# Patient Record
Sex: Male | Born: 1985 | Race: White | Hispanic: No | Marital: Single
Health system: Southern US, Community
[De-identification: ages and names within clinical notes are randomized; demographics above are authoritative.]

---
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
## 2006-07-08 ENCOUNTER — Emergency Department: Payer: Self-pay | Admitting: Emergency Medicine

## 2006-10-17 ENCOUNTER — Emergency Department: Payer: Self-pay | Admitting: Unknown Physician Specialty

## 2007-10-25 ENCOUNTER — Emergency Department: Payer: Self-pay | Admitting: Emergency Medicine

## 2008-10-17 ENCOUNTER — Emergency Department: Payer: Self-pay | Admitting: Emergency Medicine

## 2008-12-05 ENCOUNTER — Emergency Department: Payer: Self-pay | Admitting: Unknown Physician Specialty

## 2017-02-05 ENCOUNTER — Other Ambulatory Visit: Payer: Self-pay

## 2017-02-05 ENCOUNTER — Emergency Department
Admission: EM | Admit: 2017-02-05 | Discharge: 2017-02-05 | Disposition: A | Discharge status: Home / Self Care | Payer: BLUE CROSS/BLUE SHIELD | Attending: Emergency Medicine | Admitting: Emergency Medicine

## 2017-02-05 ENCOUNTER — Emergency Department: Payer: BLUE CROSS/BLUE SHIELD

## 2017-02-05 DIAGNOSIS — K219 Gastro-esophageal reflux disease without esophagitis: Secondary | ICD-10-CM | POA: Insufficient documentation

## 2017-02-05 DIAGNOSIS — R079 Chest pain, unspecified: Secondary | ICD-10-CM | POA: Diagnosis present

## 2017-02-05 LAB — CBC WITH DIFFERENTIAL/PLATELET
Basophils Absolute: 0.1 10*3/uL (ref 0–0.1)
Basophils Relative: 1 %
Eosinophils Absolute: 0.1 10*3/uL (ref 0–0.7)
Eosinophils Relative: 1 %
HCT: 40.8 % (ref 40.0–52.0)
Hemoglobin: 14.3 g/dL (ref 13.0–18.0)
Lymphocytes Relative: 14 %
Lymphs Abs: 1.6 10*3/uL (ref 1.0–3.6)
MCH: 28.6 pg (ref 26.0–34.0)
MCHC: 34.9 g/dL (ref 32.0–36.0)
MCV: 81.9 fL (ref 80.0–100.0)
Monocytes Absolute: 0.6 10*3/uL (ref 0.2–1.0)
Monocytes Relative: 5 %
Neutro Abs: 8.9 10*3/uL — ABNORMAL HIGH (ref 1.4–6.5)
Neutrophils Relative %: 79 %
Platelets: 267 10*3/uL (ref 150–440)
RBC: 4.98 MIL/uL (ref 4.40–5.90)
RDW: 13 % (ref 11.5–14.5)
WBC: 11.2 10*3/uL — ABNORMAL HIGH (ref 3.8–10.6)

## 2017-02-05 LAB — URINALYSIS, COMPLETE (UACMP) WITH MICROSCOPIC
Bacteria, UA: NONE SEEN
Bilirubin Urine: NEGATIVE
Glucose, UA: NEGATIVE mg/dL
Hgb urine dipstick: NEGATIVE
Ketones, ur: NEGATIVE mg/dL
Leukocytes, UA: NEGATIVE
Nitrite: NEGATIVE
Protein, ur: NEGATIVE mg/dL
Specific Gravity, Urine: 1.003 — ABNORMAL LOW (ref 1.005–1.030)
Squamous Epithelial / HPF: NONE SEEN
pH: 8 (ref 5.0–8.0)

## 2017-02-05 LAB — COMPREHENSIVE METABOLIC PANEL
ALT: 33 U/L (ref 17–63)
AST: 26 U/L (ref 15–41)
Albumin: 4.6 g/dL (ref 3.5–5.0)
Alkaline Phosphatase: 59 U/L (ref 38–126)
Anion gap: 7 (ref 5–15)
BUN: 10 mg/dL (ref 6–20)
CO2: 28 mmol/L (ref 22–32)
Calcium: 9.3 mg/dL (ref 8.9–10.3)
Chloride: 105 mmol/L (ref 101–111)
Creatinine, Ser: 1.02 mg/dL (ref 0.61–1.24)
GFR calc Af Amer: >60 mL/min (ref >60)
GFR calc non Af Amer: >60 mL/min (ref >60)
Glucose, Bld: 113 mg/dL — ABNORMAL HIGH (ref 65–99)
Potassium: 3.8 mmol/L (ref 3.5–5.1)
Sodium: 140 mmol/L (ref 135–145)
Total Bilirubin: 0.4 mg/dL (ref 0.3–1.2)
Total Protein: 7.4 g/dL (ref 6.5–8.1)

## 2017-02-05 LAB — TROPONIN I: Troponin I: <0.03 ng/mL (ref <0.03)

## 2017-02-05 MED ORDER — DICYCLOMINE HCL 10 MG PO CAPS
10.0000 mg | ORAL_CAPSULE | Freq: Once | ORAL | Status: AC
  Administered 2017-02-05: 10 mg via ORAL
  Filled 2017-02-05: qty 1

## 2017-02-05 MED ORDER — OMEPRAZOLE 20 MG PO CPDR: 20.0000 mg | DELAYED_RELEASE_CAPSULE | Freq: Two times a day (BID) | ORAL | 1 refills | Status: AC

## 2017-02-05 NOTE — ED Provider Notes (Signed)
[facility] Emergency Department Provider Note  ____________________________________________  Time seen: Approximately 8:50 PM  I have reviewed the triage vital signs and the nursing notes.   HISTORY  Chief Complaint Chest Pain    HPI Joe Mendoza is a 31 y.o. male presenting to the emergency department with bilateral upper rib cage pain for the past 2 nights. Patient states the pain is associated with nausea and mild shortness of breath. Patient denies recent illness and non-productive cough. He denies chest pain and chest tightness. Patient states he has a history of GERD and takes Prilosec. Patient states that his pain started yesterday in association with a supine position and after eating. Patient states that the pain went away completely and recurred today at 3:00 PM. Patient states that he drinks multiple sodas daily. Denies vomiting or abdominal pain. No hematochezia or hematuria. He denies dysuria. He currently rates his pain 4 out of 10 in intensity.   No past medical history on file.  There are no active problems to display for this patient.   No past surgical history on file.  Prior to Admission medications   Medication Sig Start Date End Date Taking? Authorizing Provider  omeprazole (PRILOSEC) 20 MG capsule Take 1 capsule (20 mg total) by mouth 2 (two) times daily. 02/05/17 03/05/17  Orvil Feil, PA-C    Allergies Omnicef [cefdinir]  No family history on file.  Social History Social History  Substance Use Topics  . Smoking status: Not on file  . Smokeless tobacco: Not on file  . Alcohol use Not on file     Review of Systems  Constitutional: No fever/chills Eyes: No visual changes. No discharge ENT: No upper respiratory complaints. Cardiovascular: no chest pain. Respiratory: no cough. No SOB. Gastrointestinal: No abdominal pain.  No nausea, no vomiting.  No diarrhea.  No constipation. Musculoskeletal: Negative for  musculoskeletal pain. Skin: Negative for rash, abrasions, lacerations, ecchymosis. Neurological: Patient has headache, no focal weakness or numbness.  ____________________________________________   PHYSICAL EXAM:  VITAL SIGNS: ED Triage Vitals  Enc Vitals Group     BP 02/05/17 1944 (!) 143/83     Pulse Rate 02/05/17 1944 91     Resp 02/05/17 1944 20     Temp 02/05/17 1944 98.2 F (36.8 C)     Temp Source 02/05/17 1944 Oral     SpO2 02/05/17 1944 99 %     Weight 02/05/17 1945 180 lb (81.6 kg)     Height 02/05/17 1945  (1.676 m)     Head Circumference --      Peak Flow --      Pain Score 02/05/17 1944 8     Pain Loc --      Pain Edu? --      Excl. in GC? --      Constitutional: Alert and oriented. Well appearing and in no acute distress. Eyes: Conjunctivae are normal. PERRL. EOMI. Head: Atraumatic. Cardiovascular: Normal rate, regular rhythm. Normal S1 and S2.  Good peripheral circulation. No reproducible pain to palpation over the chest wall. Respiratory: Normal respiratory effort without tachypnea or retractions. Lungs CTAB. Good air entry to the bases with no decreased or absent breath sounds. Gastrointestinal: Bowel sounds 4 quadrants. Soft and nontender to palpation. No guarding or rigidity. No palpable masses. No distention. No CVA tenderness. Musculoskeletal: Full range of motion to all extremities. No gross deformities appreciated. Neurologic:  Normal speech and language. No gross focal neurologic deficits are appreciated.  Skin:  Skin is warm, dry and intact. No rash noted. Psychiatric: Mood and affect are normal. Speech and behavior are normal. Patient exhibits appropriate insight and judgement.   ____________________________________________   LABS (all labs ordered are listed, but only abnormal results are displayed)  Labs Reviewed  CBC WITH DIFFERENTIAL/PLATELET - Abnormal; Notable for the following:       Result Value   WBC 11.2 (*)    Neutro Abs  8.9 (*)    All other components within normal limits  COMPREHENSIVE METABOLIC PANEL - Abnormal; Notable for the following:    Glucose, Bld 113 (*)    All other components within normal limits  URINALYSIS, COMPLETE (UACMP) WITH MICROSCOPIC - Abnormal; Notable for the following:    Color, Urine COLORLESS (*)    APPearance CLEAR (*)    Specific Gravity, Urine 1.003 (*)    All other components within normal limits  TROPONIN I   ____________________________________________  EKG  Normal sinus rhythm without ST segment elevation. ____________________________________________  RADIOLOGY Geraldo Pitter, personally viewed and evaluated these images (plain radiographs) as part of my medical decision making, as well as reviewing the written report by the radiologist.  Dg Chest 2 View  Result Date: 02/05/2017 CLINICAL DATA:  Pt states he woke up last night with bilat rib pain denies any injury. Pt worse on movement and inspiration. States now pain is just to right rib area, no chest or abd pain. Pt co shob EXAM: CHEST  2 VIEW COMPARISON:  None. FINDINGS: The heart size and mediastinal contours are within normal limits. Both lungs are clear. No pleural effusion or pneumothorax. Skeletal structures are unremarkable. IMPRESSION: No active cardiopulmonary disease. Electronically Signed   By: Amie Portland M.D.   On: 02/05/2017 20:19    ____________________________________________    PROCEDURES  Procedure(s) performed:    Procedures    Medications  dicyclomine (BENTYL) capsule 10 mg (10 mg Oral Given 02/05/17 2133)     ____________________________________________   INITIAL IMPRESSION / ASSESSMENT AND PLAN / ED COURSE  Pertinent labs & imaging results that were available during my care of the patient were reviewed by me and considered in my medical decision making (see chart for details).  Review of the [place] CSRS was performed in accordance of the [facility] prior to dispensing any  controlled drugs.    Assessment and Plan:  GERD Patient presents to the emergency department with bilateral upper rib pain. CBC revealed mild leukocytosis. CMP, troponin, urinalysis and chest x-ray were reassuring. Patient states that he has not been taking his Prilosec as directed. History is consistent with GERD. Patient was discharged with Prilosec. He was advised to seek care with primary care if symptoms are refractory to Prilosec. Patient's vital signs remained reassuring throughout emergency department encounter. All patient questions were answered.   ____________________________________________  FINAL CLINICAL IMPRESSION(S) / ED DIAGNOSES  Final diagnoses:  Chronic GERD      NEW MEDICATIONS STARTED DURING THIS VISIT:  Discharge Medication List as of 02/05/2017 10:11 PM    START taking these medications   Details  omeprazole (PRILOSEC) 20 MG capsule Take 1 capsule (20 mg total) by mouth 2 (two) times daily., Starting Thu 02/05/2017, Until Thu 03/05/2017, Print            This chart was dictated using voice recognition software/Dragon. Despite best efforts to proofread, errors can occur which can change the meaning. Any change was purely unintentional.\   Pia Mau [place], [place] 02/05/17 2342  Dionne BucySiadecki, Sebastian, MD 02/05/17 [id number]

## 2017-02-05 NOTE — ED Notes (Signed)
Pt c/o bilateral rib cage pain xfew days, denies injury. Pt denies any CP or cough. PT states some SOB, denies fever

## 2017-02-05 NOTE — ED Triage Notes (Signed)
Pt states he woke up last night with bilat rib pain denies any injury. Pt worse on movement and inspiration. States now pain is just to right rib area, no chest or abd pain. Pt co shob, none noted at this time.

## 2018-05-23 IMAGING — CR DG CHEST 2V
2 series · 2 of 2 positions shown · non-contrast
Comparison: None.

CLINICAL DATA: Pt states he woke up last night with bilat rib pain
denies any injury. Pt worse on movement and inspiration. States now
pain is just to right rib area, no chest or abd pain. Pt co shob

EXAM:
CHEST  2 VIEW

[chest pa]
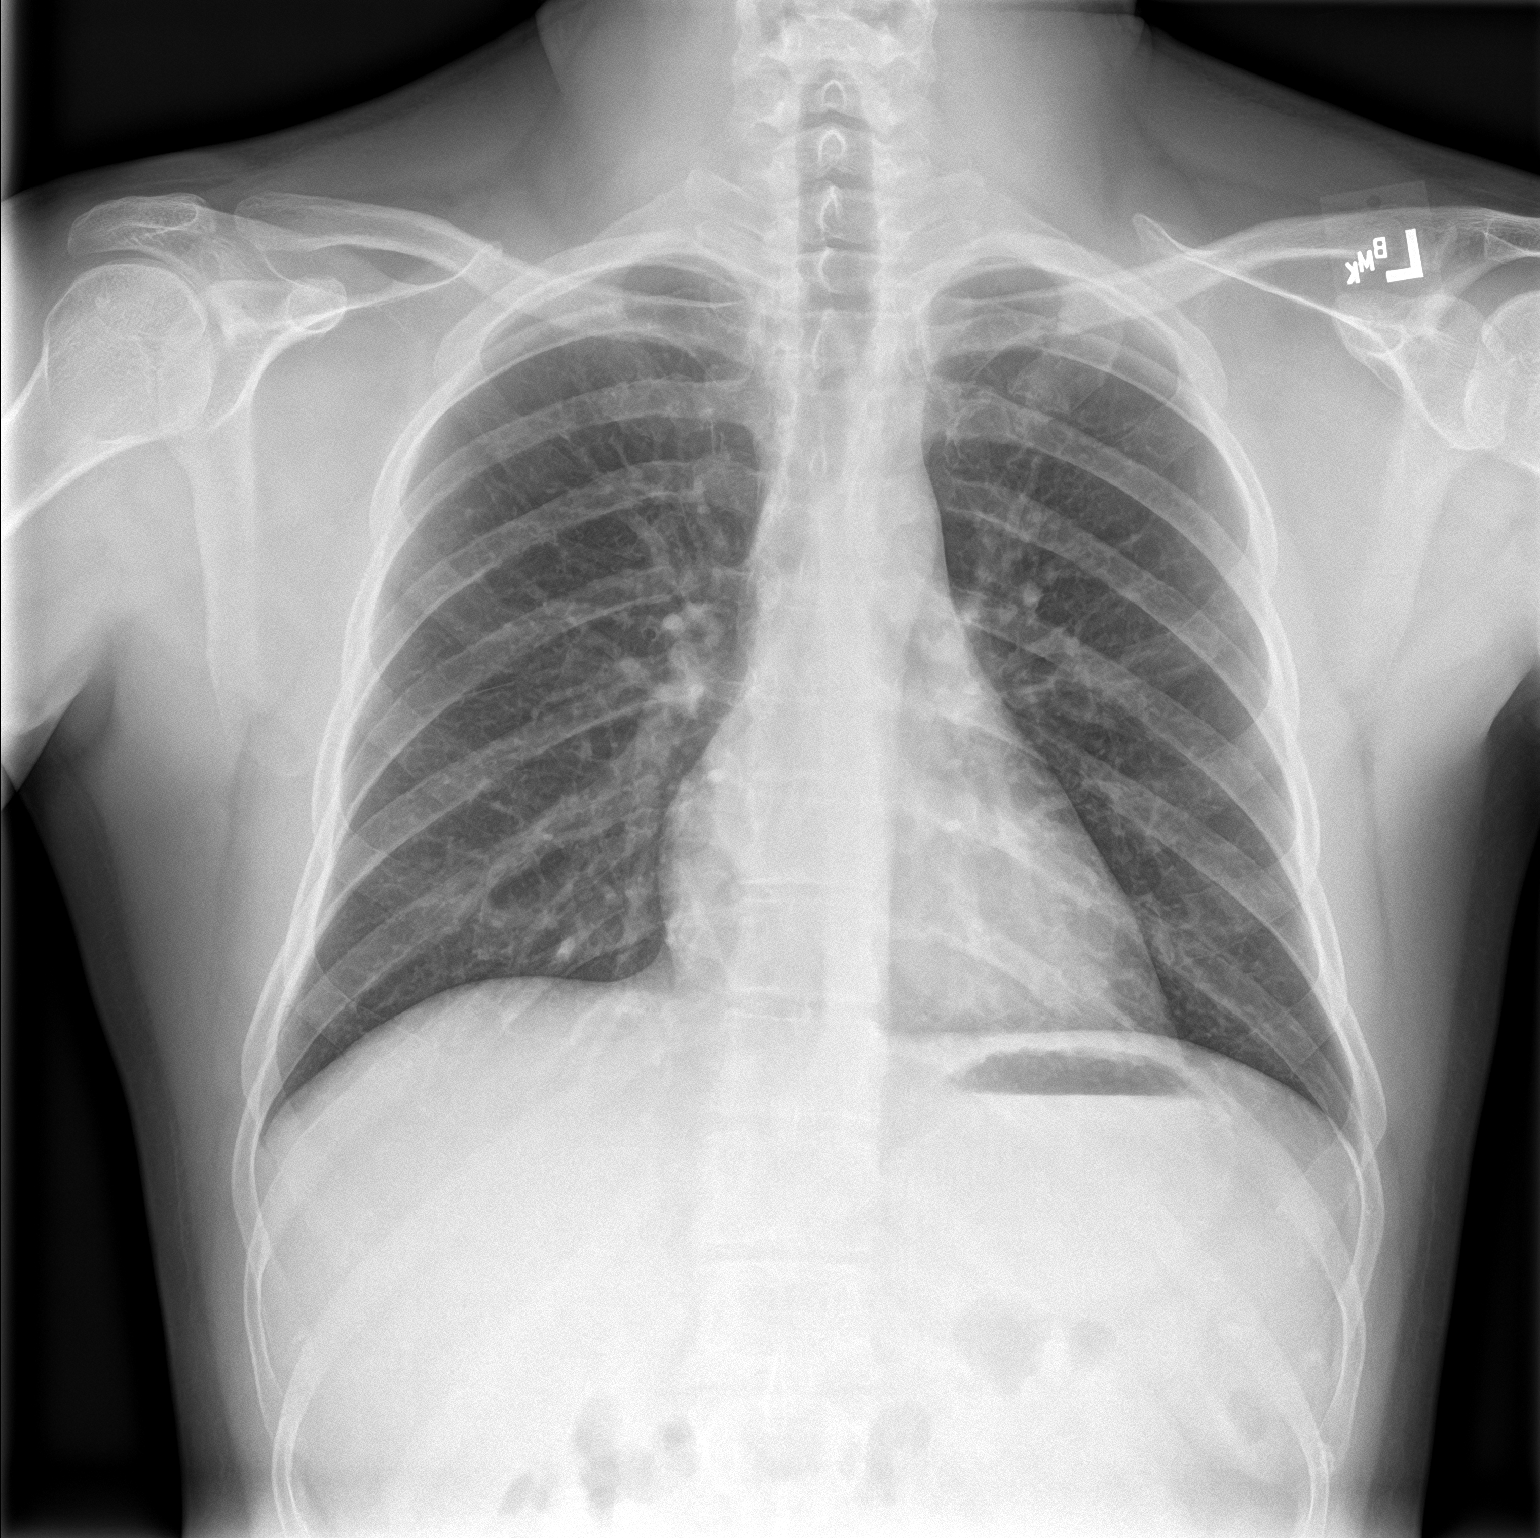

[chest lat]
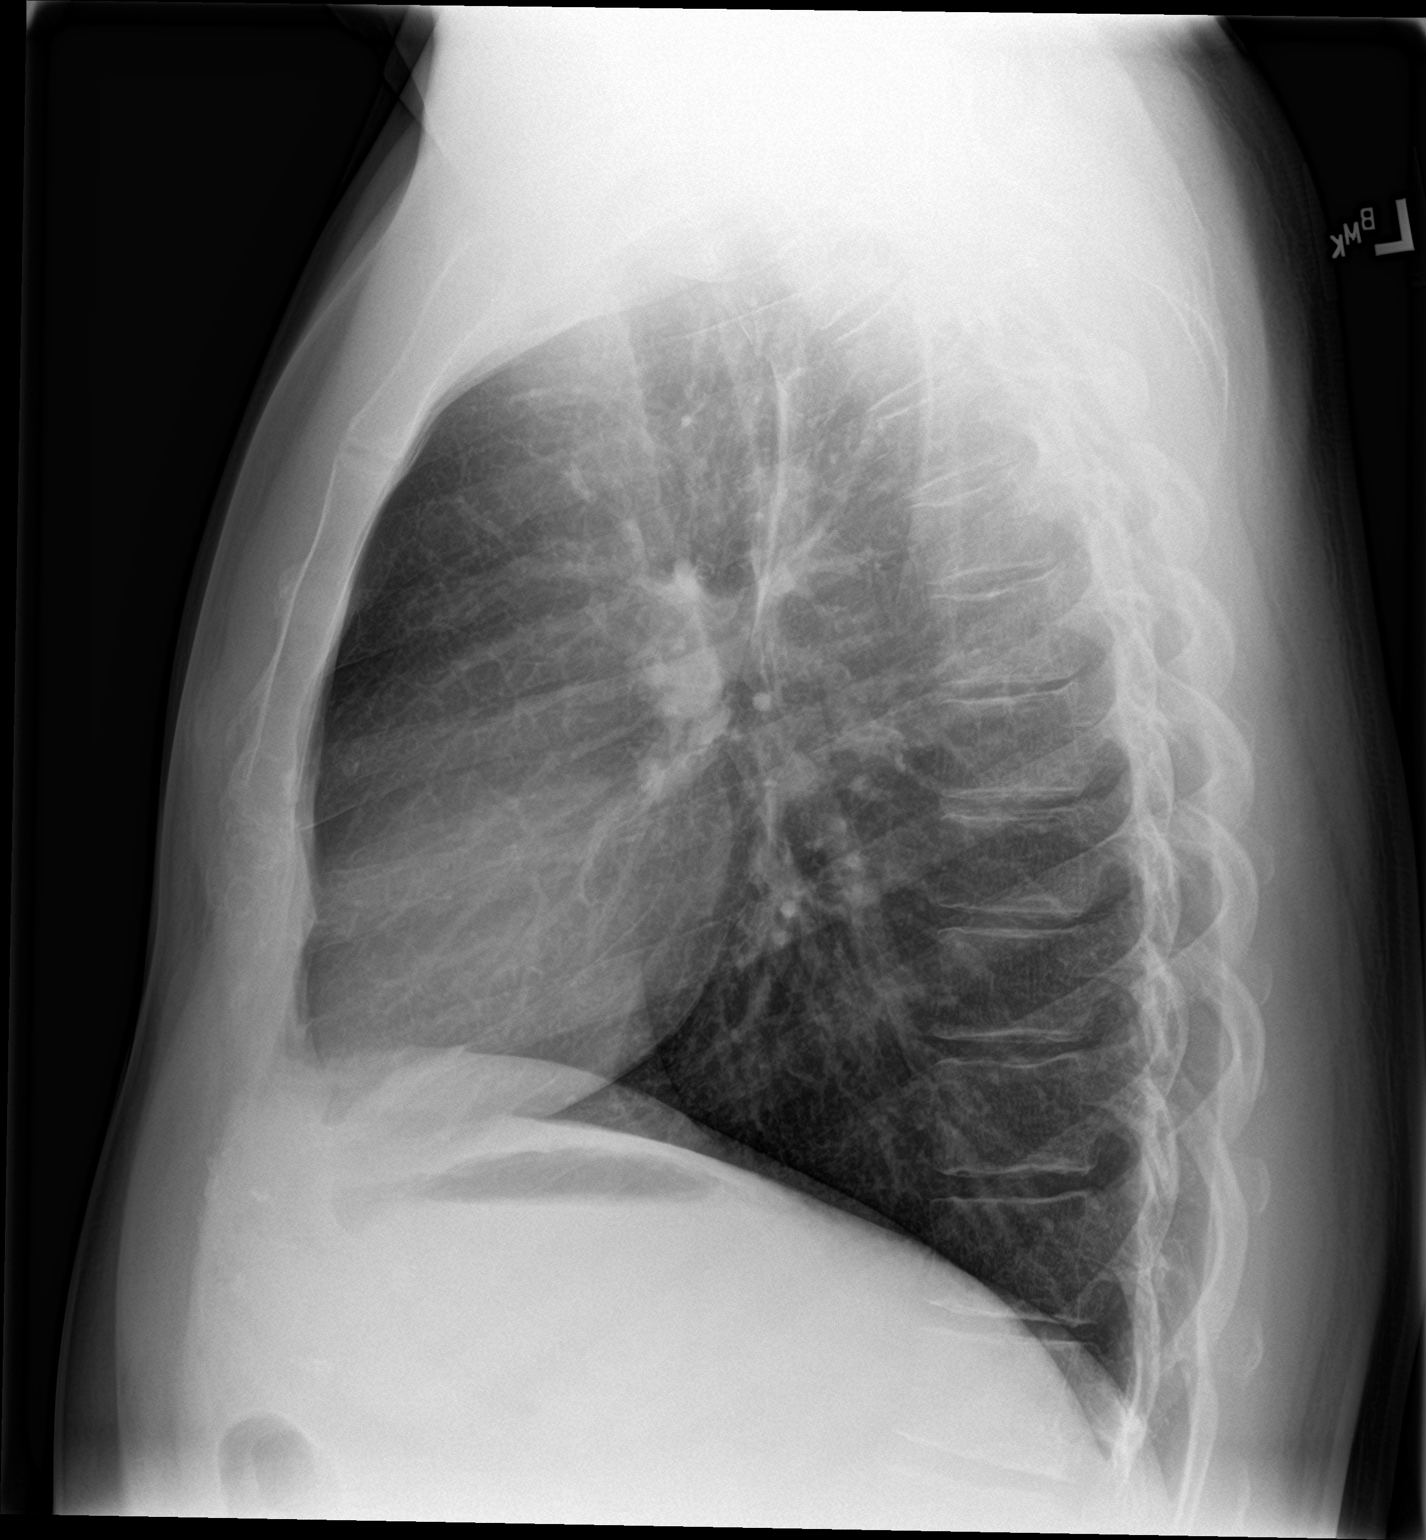

[2 of 2 positions shown; findings below may reference images not displayed]

FINDINGS: The heart size and mediastinal contours are within normal limits.
Both lungs are clear. No pleural effusion or pneumothorax. Skeletal
structures are unremarkable.
IMPRESSION: No active cardiopulmonary disease.
